# Patient Record
Sex: Male | Born: 1980 | Race: White | Marital: Married | Smoking: Never smoker
Health system: Southern US, Community
[De-identification: ages and names within clinical notes are randomized; demographics above are authoritative.]

## PROBLEM LIST (undated history)

## (undated) DIAGNOSIS — I10 Essential (primary) hypertension: Secondary | ICD-10-CM

## (undated) DIAGNOSIS — F1020 Alcohol dependence, uncomplicated: Secondary | ICD-10-CM

## (undated) DIAGNOSIS — F419 Anxiety disorder, unspecified: Secondary | ICD-10-CM

---

## 2010-09-10 ENCOUNTER — Other Ambulatory Visit: Payer: Self-pay | Admitting: Family Medicine

## 2010-09-10 ENCOUNTER — Ambulatory Visit
Admission: RE | Admit: 2010-09-10 | Discharge: 2010-09-10 | Disposition: A | Discharge status: Home / Self Care | Payer: BC Managed Care – PPO | Source: Ambulatory Visit | Attending: Family Medicine | Admitting: Family Medicine

## 2010-09-10 DIAGNOSIS — M545 Low back pain: Secondary | ICD-10-CM

## 2014-05-23 ENCOUNTER — Other Ambulatory Visit: Payer: Self-pay | Admitting: *Deleted

## 2014-05-23 ENCOUNTER — Ambulatory Visit
Admission: RE | Admit: 2014-05-23 | Discharge: 2014-05-23 | Disposition: A | Discharge status: Home / Self Care | Payer: PRIVATE HEALTH INSURANCE | Source: Ambulatory Visit | Attending: *Deleted | Admitting: *Deleted

## 2014-05-23 DIAGNOSIS — M79671 Pain in right foot: Secondary | ICD-10-CM

## 2015-08-10 IMAGING — CR DG FOOT COMPLETE 3+V*R*
3 series · 3 of 3 positions shown · non-contrast
Comparison: None.

CLINICAL DATA: Foot pain at the base of the fifth metatarsal for 1
week. No injury.

EXAM:
RIGHT FOOT COMPLETE - 3+ VIEW

[view not recorded (1 of 3)]
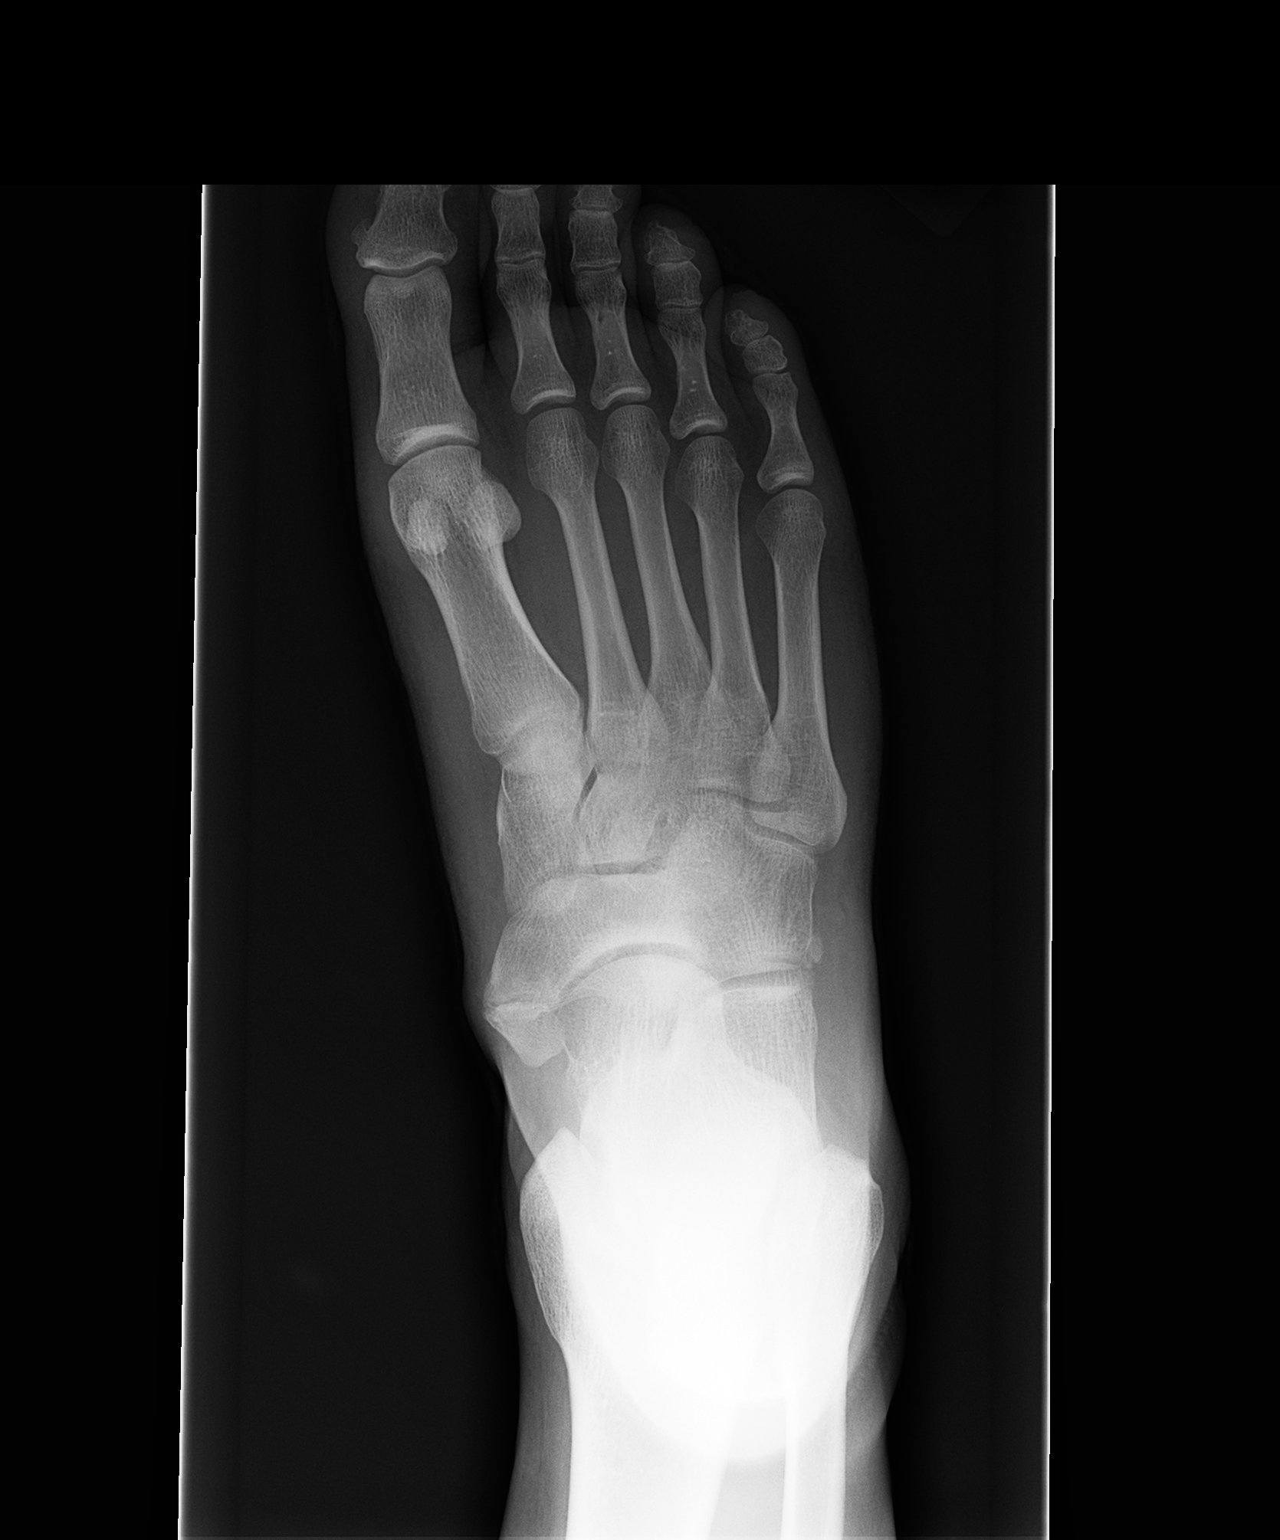

[view not recorded (2 of 3)]
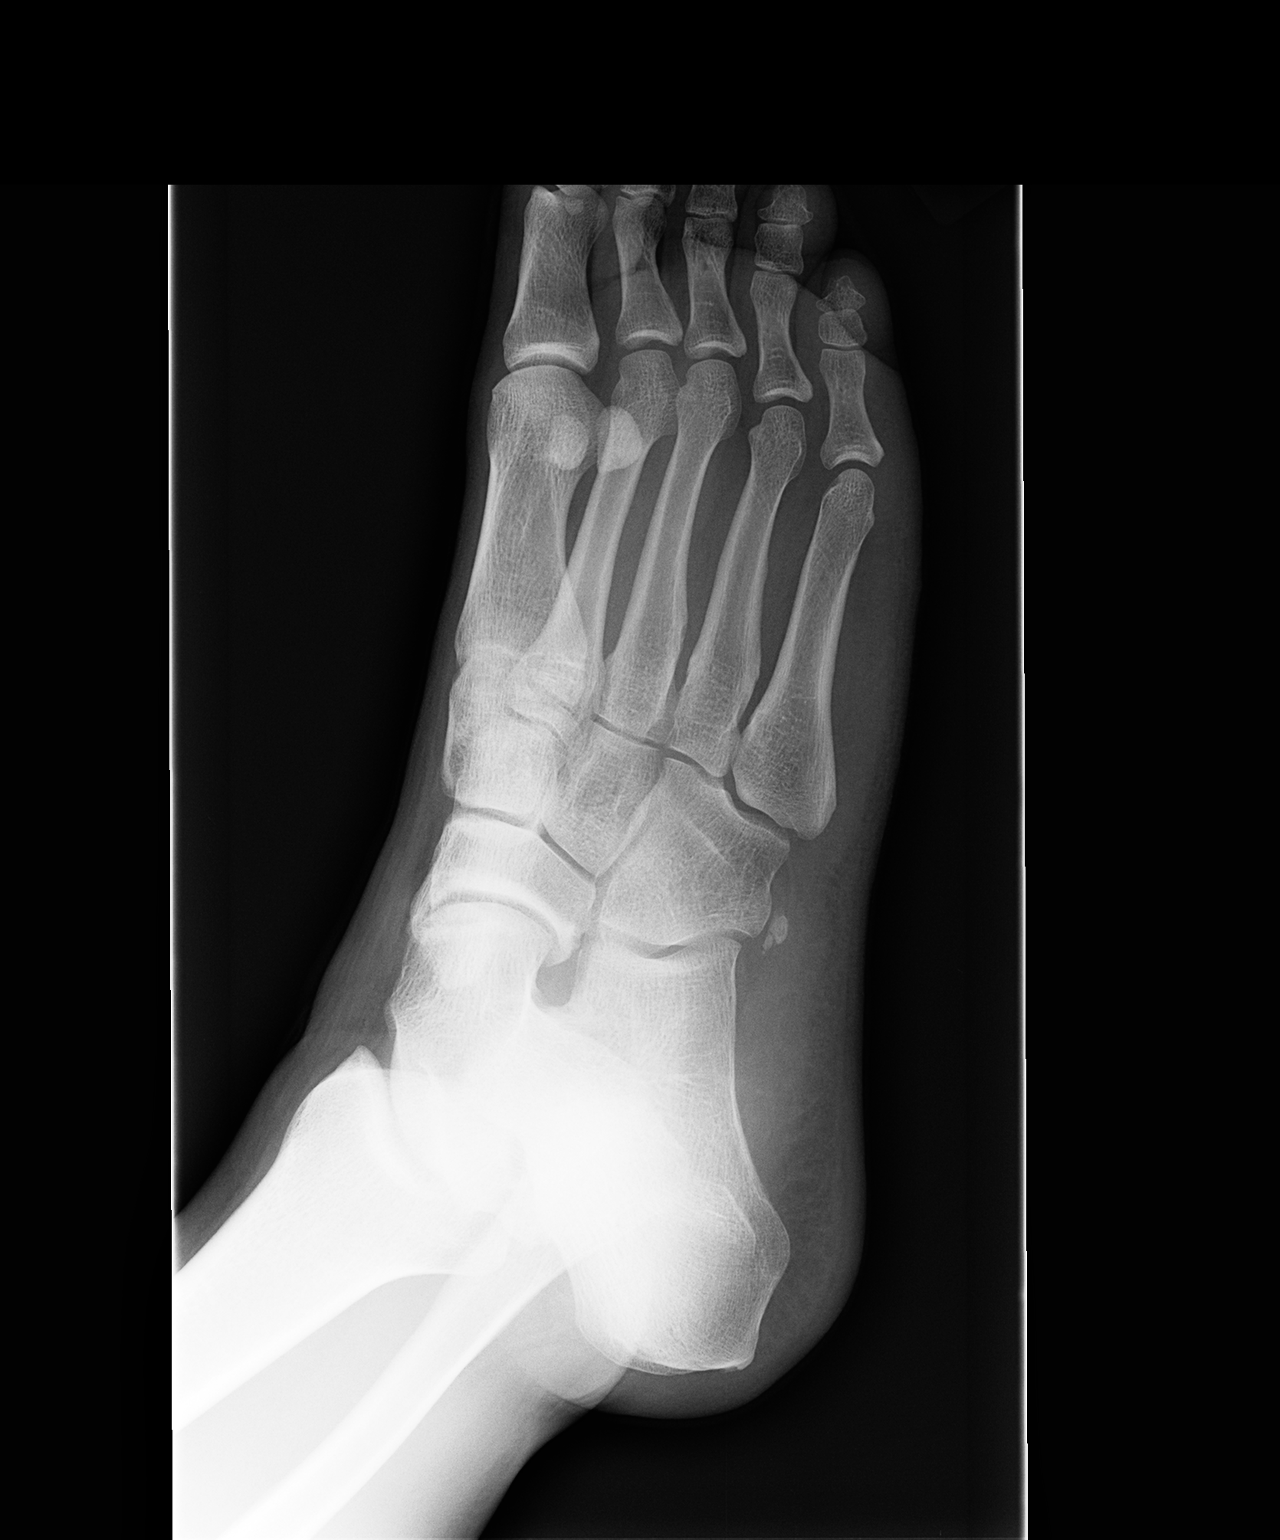

[view not recorded (3 of 3)]
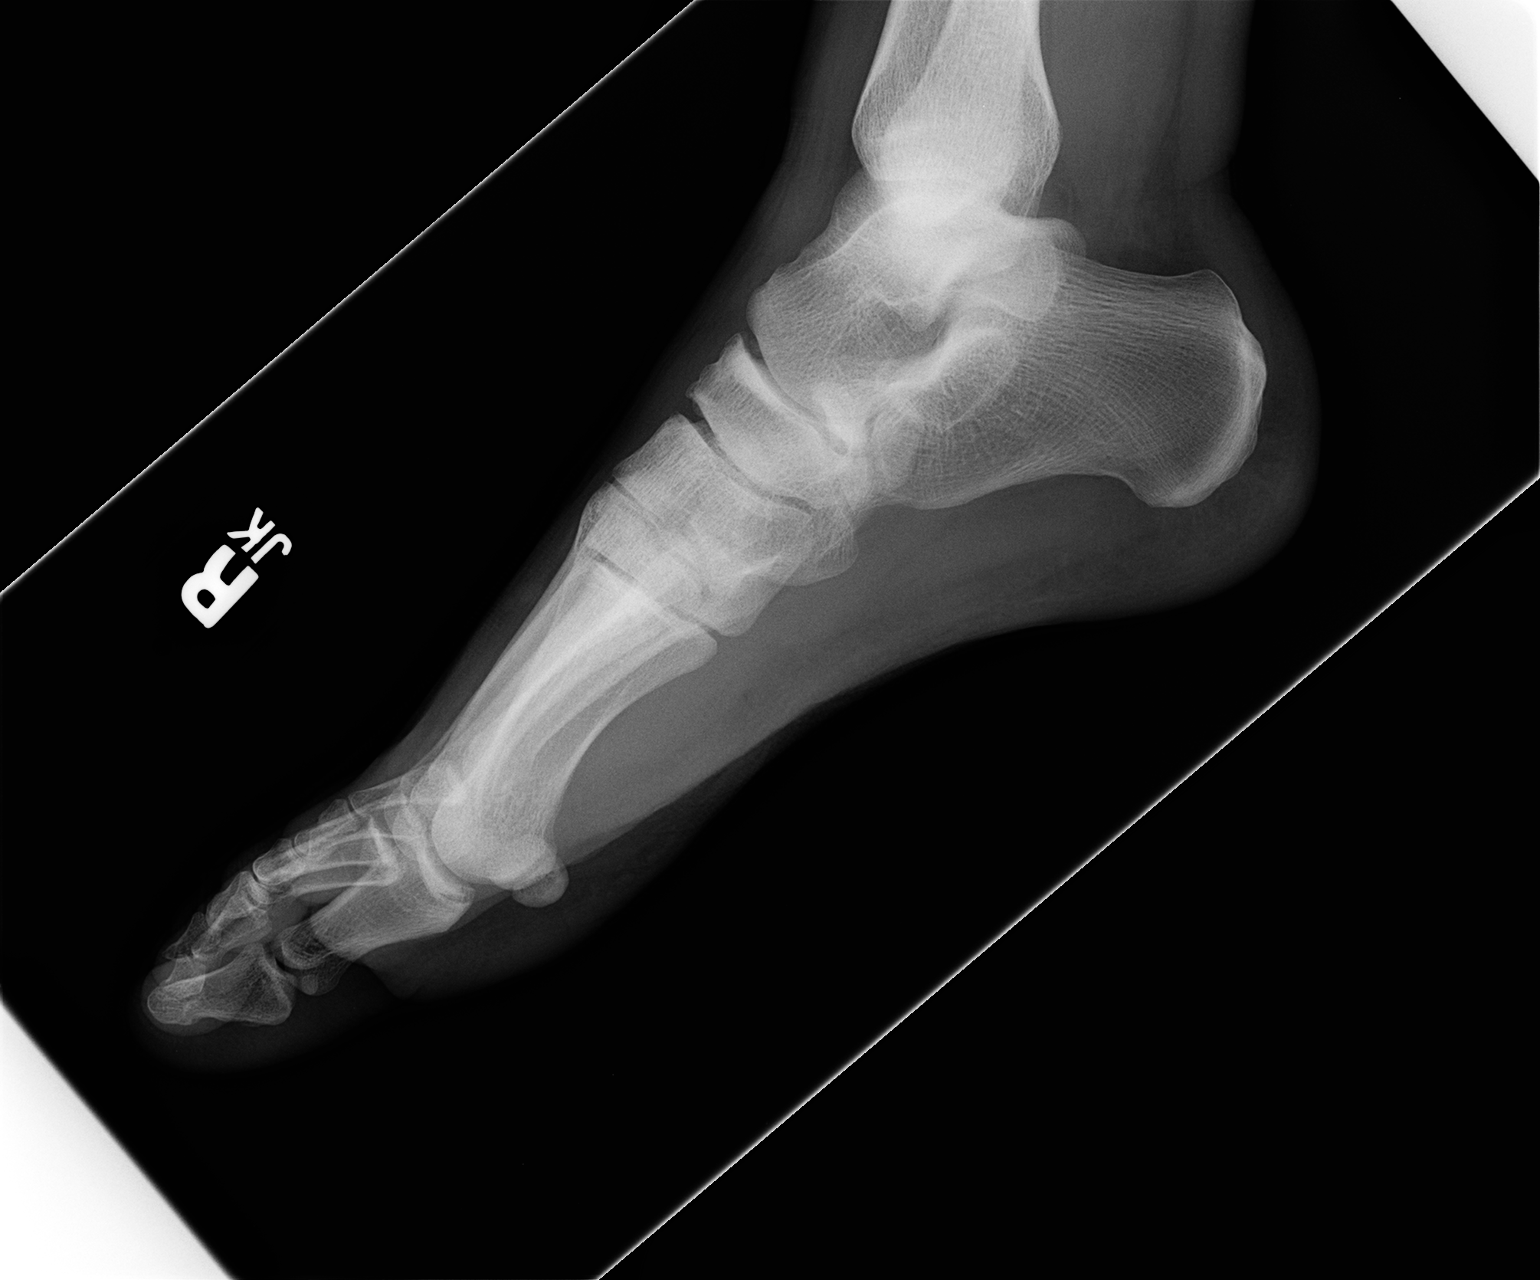

[3 of 3 positions shown; findings below may reference images not displayed]

FINDINGS: Accessory ossicle adjacent the cuboid laterally. No acute fracture
or dislocation.
IMPRESSION: No acute osseous abnormality.

## 2016-08-12 DIAGNOSIS — F329 Major depressive disorder, single episode, unspecified: Secondary | ICD-10-CM | POA: Insufficient documentation

## 2016-08-12 DIAGNOSIS — F32A Depression, unspecified: Secondary | ICD-10-CM | POA: Insufficient documentation

## 2016-08-12 DIAGNOSIS — H6692 Otitis media, unspecified, left ear: Secondary | ICD-10-CM | POA: Diagnosis not present

## 2016-08-12 DIAGNOSIS — R030 Elevated blood-pressure reading, without diagnosis of hypertension: Secondary | ICD-10-CM | POA: Diagnosis not present

## 2016-08-12 DIAGNOSIS — J019 Acute sinusitis, unspecified: Secondary | ICD-10-CM | POA: Diagnosis not present

## 2016-08-26 DIAGNOSIS — I10 Essential (primary) hypertension: Secondary | ICD-10-CM | POA: Diagnosis not present

## 2016-09-25 DIAGNOSIS — I10 Essential (primary) hypertension: Secondary | ICD-10-CM | POA: Diagnosis not present

## 2017-01-30 DIAGNOSIS — L905 Scar conditions and fibrosis of skin: Secondary | ICD-10-CM | POA: Diagnosis not present

## 2017-01-30 DIAGNOSIS — D2230 Melanocytic nevi of unspecified part of face: Secondary | ICD-10-CM | POA: Diagnosis not present

## 2017-01-30 DIAGNOSIS — D224 Melanocytic nevi of scalp and neck: Secondary | ICD-10-CM | POA: Diagnosis not present

## 2017-01-30 DIAGNOSIS — D2271 Melanocytic nevi of right lower limb, including hip: Secondary | ICD-10-CM | POA: Diagnosis not present

## 2017-01-30 DIAGNOSIS — D485 Neoplasm of uncertain behavior of skin: Secondary | ICD-10-CM | POA: Diagnosis not present

## 2017-01-30 DIAGNOSIS — Z86018 Personal history of other benign neoplasm: Secondary | ICD-10-CM | POA: Diagnosis not present

## 2017-01-30 DIAGNOSIS — D225 Melanocytic nevi of trunk: Secondary | ICD-10-CM | POA: Diagnosis not present

## 2017-02-28 DIAGNOSIS — D485 Neoplasm of uncertain behavior of skin: Secondary | ICD-10-CM | POA: Diagnosis not present

## 2017-02-28 DIAGNOSIS — L988 Other specified disorders of the skin and subcutaneous tissue: Secondary | ICD-10-CM | POA: Diagnosis not present

## 2017-03-27 DIAGNOSIS — Z23 Encounter for immunization: Secondary | ICD-10-CM | POA: Diagnosis not present

## 2017-03-27 DIAGNOSIS — R030 Elevated blood-pressure reading, without diagnosis of hypertension: Secondary | ICD-10-CM | POA: Diagnosis not present

## 2017-04-03 DIAGNOSIS — L723 Sebaceous cyst: Secondary | ICD-10-CM | POA: Diagnosis not present

## 2017-04-03 DIAGNOSIS — L989 Disorder of the skin and subcutaneous tissue, unspecified: Secondary | ICD-10-CM | POA: Diagnosis not present

## 2017-04-03 DIAGNOSIS — L7211 Pilar cyst: Secondary | ICD-10-CM | POA: Diagnosis not present

## 2017-05-09 DIAGNOSIS — J019 Acute sinusitis, unspecified: Secondary | ICD-10-CM | POA: Diagnosis not present

## 2017-06-30 DIAGNOSIS — Z0000 Encounter for general adult medical examination without abnormal findings: Secondary | ICD-10-CM | POA: Diagnosis not present

## 2017-07-31 DIAGNOSIS — J209 Acute bronchitis, unspecified: Secondary | ICD-10-CM | POA: Diagnosis not present

## 2017-08-25 DIAGNOSIS — R0981 Nasal congestion: Secondary | ICD-10-CM | POA: Diagnosis not present

## 2017-08-25 DIAGNOSIS — R0982 Postnasal drip: Secondary | ICD-10-CM | POA: Diagnosis not present

## 2017-11-30 ENCOUNTER — Emergency Department (HOSPITAL_COMMUNITY)
Admission: EM | Admit: 2017-11-30 | Discharge: 2017-11-30 | Disposition: A | Discharge status: Home / Self Care | Payer: 59 | Attending: Emergency Medicine | Admitting: Emergency Medicine

## 2017-11-30 ENCOUNTER — Encounter (HOSPITAL_COMMUNITY): Payer: Self-pay | Admitting: Emergency Medicine

## 2017-11-30 ENCOUNTER — Emergency Department (HOSPITAL_COMMUNITY): Payer: 59

## 2017-11-30 DIAGNOSIS — F10930 Alcohol use, unspecified with withdrawal, uncomplicated: Secondary | ICD-10-CM

## 2017-11-30 DIAGNOSIS — J811 Chronic pulmonary edema: Secondary | ICD-10-CM | POA: Diagnosis not present

## 2017-11-30 DIAGNOSIS — I10 Essential (primary) hypertension: Secondary | ICD-10-CM | POA: Insufficient documentation

## 2017-11-30 DIAGNOSIS — R000 Tachycardia, unspecified: Secondary | ICD-10-CM | POA: Diagnosis not present

## 2017-11-30 DIAGNOSIS — R918 Other nonspecific abnormal finding of lung field: Secondary | ICD-10-CM | POA: Diagnosis not present

## 2017-11-30 DIAGNOSIS — R9389 Abnormal findings on diagnostic imaging of other specified body structures: Secondary | ICD-10-CM

## 2017-11-30 DIAGNOSIS — F10230 Alcohol dependence with withdrawal, uncomplicated: Secondary | ICD-10-CM | POA: Diagnosis present

## 2017-11-30 DIAGNOSIS — Z79899 Other long term (current) drug therapy: Secondary | ICD-10-CM | POA: Diagnosis not present

## 2017-11-30 HISTORY — DX: Alcohol dependence, uncomplicated: F10.20

## 2017-11-30 HISTORY — DX: Anxiety disorder, unspecified: F41.9

## 2017-11-30 HISTORY — DX: Essential (primary) hypertension: I10

## 2017-11-30 LAB — COMPREHENSIVE METABOLIC PANEL
ALBUMIN: 4.5 g/dL (ref 3.5–5.0)
ALT: 89 U/L — AB (ref 17–63)
ANION GAP: 14 (ref 5–15)
AST: 54 U/L — ABNORMAL HIGH (ref 15–41)
Alkaline Phosphatase: 47 U/L (ref 38–126)
BILIRUBIN TOTAL: 1.3 mg/dL — AB (ref 0.3–1.2)
BUN: 14 mg/dL (ref 6–20)
CO2: 23 mmol/L (ref 22–32)
Calcium: 8.8 mg/dL — ABNORMAL LOW (ref 8.9–10.3)
Chloride: 97 mmol/L — ABNORMAL LOW (ref 101–111)
Creatinine, Ser: 0.9 mg/dL (ref 0.61–1.24)
GFR calc Af Amer: >60 mL/min (ref >60)
GFR calc non Af Amer: >60 mL/min (ref >60)
GLUCOSE: 108 mg/dL — AB (ref 65–99)
POTASSIUM: 3.8 mmol/L (ref 3.5–5.1)
SODIUM: 134 mmol/L — AB (ref 135–145)
TOTAL PROTEIN: 7.4 g/dL (ref 6.5–8.1)

## 2017-11-30 LAB — CBC
HEMATOCRIT: 49.9 % (ref 39.0–52.0)
HEMOGLOBIN: 17.9 g/dL — AB (ref 13.0–17.0)
MCH: 32.2 pg (ref 26.0–34.0)
MCHC: 35.9 g/dL (ref 30.0–36.0)
MCV: 89.7 fL (ref 78.0–100.0)
Platelets: 334 10*3/uL (ref 150–400)
RBC: 5.56 MIL/uL (ref 4.22–5.81)
RDW: 11.8 % (ref 11.5–15.5)
WBC: 6.2 10*3/uL (ref 4.0–10.5)

## 2017-11-30 LAB — RAPID URINE DRUG SCREEN, HOSP PERFORMED
AMPHETAMINES: NOT DETECTED
Benzodiazepines: NOT DETECTED
Cocaine: NOT DETECTED
OPIATES: NOT DETECTED
TETRAHYDROCANNABINOL: NOT DETECTED

## 2017-11-30 LAB — ETHANOL: Alcohol, Ethyl (B): 295 mg/dL — ABNORMAL HIGH (ref <10)

## 2017-11-30 MED ORDER — CHLORDIAZEPOXIDE HCL 25 MG PO CAPS: ORAL_CAPSULE | ORAL | 0 refills | Status: DC

## 2017-11-30 MED ORDER — ONDANSETRON HCL 4 MG/2ML IJ SOLN
4.0000 mg | Freq: Once | INTRAMUSCULAR | Status: AC
  Administered 2017-11-30: 4 mg via INTRAVENOUS
  Filled 2017-11-30: qty 2

## 2017-11-30 MED ORDER — SODIUM CHLORIDE 0.9 % IV BOLUS
1000.0000 mL | Freq: Once | INTRAVENOUS | Status: AC
  Administered 2017-11-30: 1000 mL via INTRAVENOUS

## 2017-11-30 MED ORDER — LORAZEPAM 1 MG PO TABS: 0.0000 mg | ORAL_TABLET | Freq: Four times a day (QID) | ORAL | Status: DC

## 2017-11-30 MED ORDER — VITAMIN B-1 100 MG PO TABS: 100.0000 mg | ORAL_TABLET | Freq: Every day | ORAL | Status: DC

## 2017-11-30 MED ORDER — LORAZEPAM 1 MG PO TABS: 0.0000 mg | ORAL_TABLET | Freq: Two times a day (BID) | ORAL | Status: DC

## 2017-11-30 MED ORDER — ONDANSETRON 4 MG PO TBDP: 4.0000 mg | ORAL_TABLET | Freq: Three times a day (TID) | ORAL | 0 refills | Status: AC | PRN

## 2017-11-30 MED ORDER — THIAMINE HCL 100 MG/ML IJ SOLN
100.0000 mg | Freq: Every day | INTRAMUSCULAR | Status: DC
  Administered 2017-11-30: 100 mg via INTRAVENOUS
  Filled 2017-11-30: qty 2

## 2017-11-30 MED ORDER — LORAZEPAM 2 MG/ML IJ SOLN
0.0000 mg | Freq: Four times a day (QID) | INTRAMUSCULAR | Status: DC
  Administered 2017-11-30: 1 mg via INTRAVENOUS
  Filled 2017-11-30: qty 1

## 2017-11-30 MED ORDER — LORAZEPAM 2 MG/ML IJ SOLN
0.5000 mg | Freq: Once | INTRAMUSCULAR | Status: AC
  Administered 2017-11-30: 0.5 mg via INTRAVENOUS
  Filled 2017-11-30: qty 1

## 2017-11-30 MED ORDER — LORAZEPAM 2 MG/ML IJ SOLN: 1.0000 mg | Freq: Once | INTRAMUSCULAR | Status: DC

## 2017-11-30 MED ORDER — LORAZEPAM 2 MG/ML IJ SOLN: 0.0000 mg | Freq: Two times a day (BID) | INTRAMUSCULAR | Status: DC

## 2017-11-30 NOTE — ED Notes (Signed)
pt ambulated independently maintaining an O2 sat of 98%.  HR increased from 108 to 125 while ambulating.  Pt did not appear to be in distress.  HR returned to 110 post-ambulation

## 2017-11-30 NOTE — ED Triage Notes (Signed)
Patient to ED with wife for alcohol withdrawal. She reports patient is a recovering alcoholic but has had relapsed multiple times this past month. Patient had panic attack Wednesday and has been self medicating with several alcohol-sized bottles daily until yesterday morning. Patient reports some intermittent confusion (A&O x 4 at this time), loss of appetite, N/V. He also states he hasn't taken his normal anxiety medication in days. Patient calm and cooperative at this time.

## 2017-11-30 NOTE — Discharge Instructions (Signed)
Take Librium taper as prescribed.  You may also take Zofran up to 3 times daily as needed for nausea and vomiting.  Wait around 20 to 30 minutes before having anything to eat or drink to give this medication time to work.  Do not drink any alcohol while taking Librium.  Follow-up with your primary care physician for reevaluation of your symptoms and possible repeat chest x-ray to ensure the fluid in your lungs has improved or resolved.  I have attached resources for follow-up with a counselor on an outpatient basis.  Return to the emergency department immediately for any concerning signs or symptoms develop such as fevers, seizures, loss of consciousness, or persistent vomiting.

## 2017-11-30 NOTE — ED Provider Notes (Signed)
[facility] Provider Note   CSN: [id number] Arrival date & time: 11/30/17  1439     History   Chief Complaint Chief Complaint  Patient presents with  . Alcohol Problem    HPI Raymond Miller is a 37 y.o. male with history of alcoholism, anxiety, and hypertension presents today accompanied by his wife for evaluation of alcohol withdrawal.  Per the patient's wife, he has been in recovery for some time but has relapsed multiple times in the past month.  This is typically subsequent to a panic attack.  Patient states that he will "self medicate "with alcohol after having panic attacks which he has dealt with since he was in his 60s.  Wife states that he usually will medicate with a few beers but while she was out of town on Wednesday 4 days ago he began drinking airplane bottles of liquor.  He is unsure how many bottles of liquor he has consumed in the past 4 days.  Wife states that his last drink was yesterday morning.  He does take Wellbutrin and Lexapro for his anxiety but has not been compliant with this medication for some time.  His PCP manages his anxiety but he does not see a psychiatrist or counselor.  He denies suicidal ideation or homicidal ideation, denies auditory or visual hallucinations.  He does use chewing tobacco but denies cigarette smoking.  He denies recreational drug use.  Patient's wife states for the past couple of days he has been fatigued/lethargic, confused.  The patient states that he feels very anxious,nauseated, and states "I feel warm all over ".  Has not been able to keep any food down for the past 2 to 3 days.  He notes at least 2 episodes of nonbloody nonbilious emesis today.  Has not tried anything for his symptoms.  He denies fevers, chills, chest pain, shortness of breath, abdominal pain, diarrhea, constipation.  Has not had seizures with alcohol withdrawals in the past.  Has not required hospitalization for alcohol withdrawal in  the past.  The history is provided by the patient.    Past Medical History:  Diagnosis Date  . Alcoholism ([place])   . Anxiety   . Hypertension     There are no active problems to display for this patient.   History reviewed. No pertinent surgical history.      Home Medications    Prior to Admission medications   Medication Sig Start Date End Date Taking? Authorizing Provider  buPROPion (WELLBUTRIN SR) 150 MG 12 hr tablet Take 150 mg by mouth 2 (two) times daily. 10/16/17  Yes [provider]  escitalopram (LEXAPRO) 5 MG tablet Take 5 mg by mouth 2 (two) times daily. 11/09/17  Yes [provider]  LORazepam (ATIVAN) 0.5 MG tablet Take 0.5 mg by mouth every 8 (eight) hours as needed for anxiety. 10/22/17  Yes [provider]  chlordiazePOXIDE (LIBRIUM) 25 MG capsule 50mg  PO TID x 1D, then 25-50mg  PO BID X 1D, then 25-50mg  PO QD X 1D 11/30/17   Daniel Johndrow A, PA-C  ondansetron (ZOFRAN ODT) 4 MG disintegrating tablet Take 1 tablet (4 mg total) by mouth every 8 (eight) hours as needed for up to 4 days for nausea or vomiting. 11/30/17 12/04/17  Renita Papa, PA-C    Family History No family history on file.  Social History Social History   Tobacco Use  . Smoking status: Never Smoker  . Smokeless tobacco: Never Used  Substance Use  Topics  . Alcohol use: Yes    Comment: several relapses this past month (6/19)  . Drug use: Not Currently     Allergies   Penicillins   Review of Systems Review of Systems  Constitutional: Positive for fatigue. Negative for chills and fever.  Respiratory: Negative for shortness of breath.   Cardiovascular: Negative for chest pain.  Gastrointestinal: Positive for nausea and vomiting. Negative for abdominal pain, constipation and diarrhea.  Musculoskeletal: Positive for myalgias.  Neurological: Negative for syncope, weakness, numbness and headaches.  Psychiatric/Behavioral: Positive for confusion.  All other systems  reviewed and are negative.    Physical Exam Updated Vital Signs BP (!) 148/96   Pulse (!) 104   Temp 98.8 F (37.1 C) (Oral)   Resp 19   Ht 5\' 6"  (1.676 m)   Wt 83.9 kg (185 lb)   SpO2 90%   BMI 29.86 kg/m   Physical Exam  Constitutional: He is oriented to person, place, and time. He appears well-developed and well-nourished. No distress.  HENT:  Head: Normocephalic and atraumatic.  Eyes: Pupils are equal, round, and reactive to light. Conjunctivae and EOM are normal. Right eye exhibits no discharge. Left eye exhibits no discharge.  Neck: Normal range of motion. Neck supple. No JVD present. No tracheal deviation present.  Cardiovascular: Regular rhythm.  Tachycardic to 105 bpm, 2+ radial and DP/PT pulses, Homans sign absent bilaterally  Pulmonary/Chest: Effort normal and breath sounds normal. No respiratory distress. He exhibits no tenderness.  Abdominal: Soft. Bowel sounds are normal. He exhibits no distension. There is no tenderness.  Musculoskeletal: Normal range of motion. He exhibits no edema or tenderness.  Neurological: He is alert and oriented to person, place, and time. No cranial nerve deficit or sensory deficit. He exhibits normal muscle tone.  Fluent speech with no evidence of dysarthria or aphasia, no facial droop.  Sensation intact to soft extremities.  EOMs cranial nerves appear grossly intact.  Answers questions appropriately.  No tremulousness of the extremities  Skin: Skin is warm and dry. No erythema.  Psychiatric: His speech is normal. His mood appears anxious. He expresses no homicidal and no suicidal ideation. He expresses no suicidal plans and no homicidal plans.  Nursing note and vitals reviewed.    ED Treatments / Results  Labs (all labs ordered are listed, but only abnormal results are displayed) Labs Reviewed  COMPREHENSIVE METABOLIC PANEL - Abnormal; Notable for the following components:      Result Value   Sodium 134 (*)    Chloride 97 (*)     Glucose, Bld 108 (*)    Calcium 8.8 (*)    AST 54 (*)    ALT 89 (*)    Total Bilirubin 1.3 (*)    All other components within normal limits  ETHANOL - Abnormal; Notable for the following components:   Alcohol, Ethyl (B) 295 (*)    All other components within normal limits  CBC - Abnormal; Notable for the following components:   Hemoglobin 17.9 (*)    All other components within normal limits  RAPID URINE DRUG SCREEN, HOSP PERFORMED - Abnormal; Notable for the following components:   Barbiturates   (*)    Value: Result not available. Reagent lot number recalled by manufacturer.   All other components within normal limits    EKG EKG Interpretation  Date/Time:  Sunday November 30 2017 18:48:27 EDT Ventricular Rate:  122 PR Interval:    QRS Duration: 88 QT Interval:  310 QTC Calculation:  442 R Axis:   101 Text Interpretation:  Sinus tachycardia Probable left atrial enlargement Right axis deviation Baseline wander in lead(s) I III aVL V6 Confirmed by Quintella Reichert [phone]) on 11/30/2017 8:26:43 PM Also confirmed by Quintella Reichert [phone]), editor Lynder Parents [phone])  on 12/01/2017 10:54:05 AM   Radiology Dg Chest 2 View  Result Date: 11/30/2017 CLINICAL DATA:  Alcohol withdrawal. EXAM: CHEST - 2 VIEW COMPARISON:  None. FINDINGS: The heart size and mediastinal contours are within normal limits. Mild diffuse interstitial edema. No alveolar consolidation, effusion or pneumothorax. The visualized skeletal structures are unremarkable. IMPRESSION: Mild diffuse interstitial edema. No effusion or pneumothorax. No pulmonary consolidation. Electronically Signed   By: Ashley Royalty M.D.   On: 11/30/2017 20:17    Procedures Procedures (including critical care time)  Medications Ordered in ED Medications  ondansetron (ZOFRAN) injection 4 mg (4 mg Intravenous Given 11/30/17 1615)  sodium chloride 0.9 % bolus 1,000 mL (0 mLs Intravenous Stopped 11/30/17 1712)  LORazepam (ATIVAN) injection 0.5 mg  (0.5 mg Intravenous Given 11/30/17 1617)  ondansetron (ZOFRAN) injection 4 mg (4 mg Intravenous Given 11/30/17 2010)     Initial Impression / Assessment and Plan / ED Course  I have reviewed the triage vital signs and the nursing notes.  Pertinent labs & imaging results that were available during my care of the patient were reviewed by me and considered in my medical decision making (see chart for details).  Patient presents for evaluation of alcohol withdrawal.  States his last drink was yesterday morning but ethanol level today is 295.  He is afebrile, intermittently tachycardic while in the ED appears somewhat anxious but in no apparent distress.  No focal neurologic deficits on examination and he is alert and oriented.  No tremulousness or seizures.  He has not been hospitalized in the past for withdrawals.  Initial CIWA score is 6 with improvement after Ativan.  Chest x-ray shows mild diffuse interstitial edema however when the patient was ambulated he maintain his O2 saturations.  He does not appear to be volume overloaded and no evidence of fulminant CHF.  Doubt PE, ACS or MI, or other acute intrathoracic abnormality in the absence of chest pain or shortness of breath.  Remainder of lab work reviewed by me shows no anemia, no significant electrolyte abnormalities.  On reevaluation the patient is resting comfortably no apparent distress.  He states he is feeling much better.  Serial abdominal examinations remain benign and I doubt acute intra-abdominal pathology.  Suspect his nausea and vomiting secondary to his withdrawals.  He was able to tolerate p.o. food and fluids in the ED after Zofran.  Will discharge with Librium taper and Zofran as needed for nausea and vomiting.  He request outpatient resources for counseling and substance abuse services which I provided to him.  Discussed strict ED return precautions.  Patient and patient's wife verbalized understanding of and agreement with plan and  patient stable for discharge home at this time.  Patient was seen and evaluated by Dr. Ralene Bathe who agrees with assessment and plan at this time.  Final Clinical Impressions(s) / ED Diagnoses   Final diagnoses:  Alcohol withdrawal syndrome without complication (HCC)  Abnormal chest x-ray    ED Discharge Orders        Ordered    chlordiazePOXIDE (LIBRIUM) 25 MG capsule     11/30/17 2150    ondansetron (ZOFRAN ODT) 4 MG disintegrating tablet  Every 8 hours PRN     11/30/17 2150  Renita Papa, PA-C 12/01/17 1512    Quintella Reichert, MD 12/09/17 [phone]

## 2017-11-30 NOTE — ED Notes (Signed)
Patient given ginger ale. 

## 2017-12-03 DIAGNOSIS — J811 Chronic pulmonary edema: Secondary | ICD-10-CM | POA: Diagnosis not present

## 2017-12-03 DIAGNOSIS — R112 Nausea with vomiting, unspecified: Secondary | ICD-10-CM | POA: Diagnosis not present

## 2017-12-05 ENCOUNTER — Ambulatory Visit
Admission: RE | Admit: 2017-12-05 | Discharge: 2017-12-05 | Disposition: A | Discharge status: Home / Self Care | Payer: 59 | Source: Ambulatory Visit | Attending: Family Medicine | Admitting: Family Medicine

## 2017-12-05 ENCOUNTER — Other Ambulatory Visit: Payer: Self-pay | Admitting: Family Medicine

## 2017-12-05 DIAGNOSIS — J810 Acute pulmonary edema: Secondary | ICD-10-CM

## 2017-12-05 DIAGNOSIS — J811 Chronic pulmonary edema: Secondary | ICD-10-CM | POA: Diagnosis not present

## 2017-12-29 DIAGNOSIS — I10 Essential (primary) hypertension: Secondary | ICD-10-CM | POA: Diagnosis not present

## 2018-02-14 DIAGNOSIS — J9801 Acute bronchospasm: Secondary | ICD-10-CM | POA: Diagnosis not present

## 2018-02-14 DIAGNOSIS — J019 Acute sinusitis, unspecified: Secondary | ICD-10-CM | POA: Diagnosis not present

## 2018-03-25 DIAGNOSIS — Z23 Encounter for immunization: Secondary | ICD-10-CM | POA: Diagnosis not present

## 2018-07-01 DIAGNOSIS — Z0000 Encounter for general adult medical examination without abnormal findings: Secondary | ICD-10-CM | POA: Diagnosis not present

## 2018-07-01 DIAGNOSIS — E785 Hyperlipidemia, unspecified: Secondary | ICD-10-CM | POA: Diagnosis not present

## 2018-07-01 DIAGNOSIS — I10 Essential (primary) hypertension: Secondary | ICD-10-CM | POA: Diagnosis not present

## 2018-08-06 DIAGNOSIS — M545 Low back pain: Secondary | ICD-10-CM | POA: Diagnosis not present

## 2018-09-18 DIAGNOSIS — M9905 Segmental and somatic dysfunction of pelvic region: Secondary | ICD-10-CM | POA: Diagnosis not present

## 2018-09-18 DIAGNOSIS — M9902 Segmental and somatic dysfunction of thoracic region: Secondary | ICD-10-CM | POA: Diagnosis not present

## 2018-09-18 DIAGNOSIS — M9903 Segmental and somatic dysfunction of lumbar region: Secondary | ICD-10-CM | POA: Diagnosis not present

## 2018-11-26 ENCOUNTER — Ambulatory Visit: Payer: Self-pay

## 2018-11-26 ENCOUNTER — Other Ambulatory Visit: Payer: Self-pay

## 2018-11-26 ENCOUNTER — Ambulatory Visit (INDEPENDENT_AMBULATORY_CARE_PROVIDER_SITE_OTHER): Payer: 59 | Admitting: Family Medicine

## 2018-11-26 ENCOUNTER — Encounter: Payer: Self-pay | Admitting: Family Medicine

## 2018-11-26 DIAGNOSIS — F419 Anxiety disorder, unspecified: Secondary | ICD-10-CM | POA: Insufficient documentation

## 2018-11-26 DIAGNOSIS — M545 Low back pain, unspecified: Secondary | ICD-10-CM

## 2018-11-26 DIAGNOSIS — G8929 Other chronic pain: Secondary | ICD-10-CM | POA: Diagnosis not present

## 2018-11-26 DIAGNOSIS — M25552 Pain in left hip: Secondary | ICD-10-CM | POA: Diagnosis not present

## 2018-11-26 MED ORDER — TIZANIDINE HCL 2 MG PO TABS: 2.0000 mg | ORAL_TABLET | Freq: Four times a day (QID) | ORAL | 1 refills | Status: AC | PRN

## 2018-11-26 MED ORDER — NABUMETONE 750 MG PO TABS: 750.0000 mg | ORAL_TABLET | Freq: Two times a day (BID) | ORAL | 6 refills | Status: AC | PRN

## 2018-11-26 NOTE — Progress Notes (Signed)
Office Visit Note   Patient: Raymond Miller           Date of Birth: January 27, 1981           MRN: [id number] Visit Date: 11/26/2018 Requested by: [name], [place],  [place] [address] PCP: [name], MD  Subjective: Chief Complaint  Patient presents with  . Left Hip - Pain    Pain started in lower back and around to the hip last November. Has had lower back pain since 2013. Occasionally pain radiates down the leg.    HPI: He is a 38 year old with low back and left hip pain.  Longstanding intermittent problems with his lower back since childhood.  Around 2013 he had a severe episode of pain during which time he was treated with chiropractic, muscle relaxants, and had some x-rays done which were unremarkable per his report.  The muscle relaxants seemed to help and he was tolerating his intermittent pain until this past November.  There was no injury to cause this.  Pain pattern is different than usual, with left lower back pain and deep pain in the anterior hip down toward the knee.  Position does not matter, but his pain gets much worse when he is not being physically active.  Surprisingly, physical activity does not seem to affect his pain and he actually feels pretty good sometimes while exercising or working in his yard.  He tried chiropractic again but it did not help.  Medications including ibuprofen gives him some relief.  He does have a family history of HLA-B27 positivity in a couple family members and his mother has been diagnosed with ankylosing spondylitis.               ROS: Has bowel or bladder dysfunction, fever or chills.  No other joints are bothering him.  All other systems were reviewed and are negative.  Objective: Vital Signs: There were no vitals taken for this visit.  Physical Exam:  General:  Alert and oriented, in no acute distress. Pulm:  Breathing unlabored. Psy:  Normal mood, congruent affect. Skin: No rash on his skin.  Low back: No scoliosis, no tenderness to palpation of the lumbar spinous processes or SI joints, no pain in the sciatic notch.  Bilateral tight hamstrings but negative straight leg raise, lower extremity strength and reflexes are normal.  No pain and good range of motion with bilateral hip internal and external rotation.  No pain with lumbar hyperextension maneuvers.  Imaging: X-rays lumbar spine:  Moderate L5-S1 DDD.  No other disc space abnormalities.  No SI sclerosis.  No scoliosis or congenital deformities.  X-rays left hip:  Normal joint space, no sign of stress fracture.    Assessment & Plan: 1.  Chronic low back pain now with left hip pain, etiology uncertain.  Suspect lumbar disc protrusion although pain pattern is not typical.  Family history of ankylosing spondylitis, but symptoms are not typical for that either. -MRI to further evaluate.  Suggested labs as well, but he is somewhat needle phobic and would prefer to wait on that for now. -Try some new medications as well.  Consider a trial of physical therapy depending on MRI results.     Procedures: No procedures performed  No notes on file     PMFS History: Patient Active Problem List   Diagnosis Date Noted  . Anxiety 11/26/2018  . Depressive disorder 08/12/2016   Past Medical History:  Diagnosis Date  .  Alcoholism ([place])   . Anxiety   . Hypertension     History reviewed. No pertinent family history.  History reviewed. No pertinent surgical history. Social History   Occupational History  . Not on file  Tobacco Use  . Smoking status: Never Smoker  . Smokeless tobacco: Never Used  Substance and Sexual Activity  . Alcohol use: Yes    Comment: several relapses this past month (6/19)  . Drug use: Not Currently  . Sexual activity: Not on file

## 2018-12-04 ENCOUNTER — Other Ambulatory Visit: Payer: Self-pay | Admitting: Family Medicine

## 2018-12-04 DIAGNOSIS — M545 Low back pain, unspecified: Secondary | ICD-10-CM

## 2018-12-04 DIAGNOSIS — G8929 Other chronic pain: Secondary | ICD-10-CM

## 2018-12-16 ENCOUNTER — Telehealth: Payer: Self-pay | Admitting: Family Medicine

## 2018-12-16 NOTE — Telephone Encounter (Signed)
The PT order has been refaxed over to [facility] (we have had some issues with the fax machine lately). I left this information on the patient's voice mail, along with their phone # so he could call and schedule his own appointment - [phone].

## 2018-12-16 NOTE — Telephone Encounter (Signed)
Patient called advised the (PT) has not contacted him for (PT) and want to know what to do next? The number to contact patient is [phone]

## 2018-12-22 ENCOUNTER — Other Ambulatory Visit: Payer: 59

## 2018-12-30 ENCOUNTER — Telehealth: Payer: Self-pay | Admitting: *Deleted

## 2018-12-30 NOTE — Telephone Encounter (Signed)
Pt called stating he is wanting to switch PT providers from [facility] to [facility] physical therapy, pt states his wife goes to [facility] close to hospital and would like to go there. Pt is going to call me back to give me the information so I can fax orders to them. Pt verbally agreed to send over referral to them

## 2019-02-22 IMAGING — DX DG CHEST 2V
2 series · 2 of 2 positions shown · non-contrast
Comparison: 11/30/2017

CLINICAL DATA: Follow-up pulmonary edema

EXAM:
CHEST - 2 VIEW

[dg chest 2 view (1 of 2)]
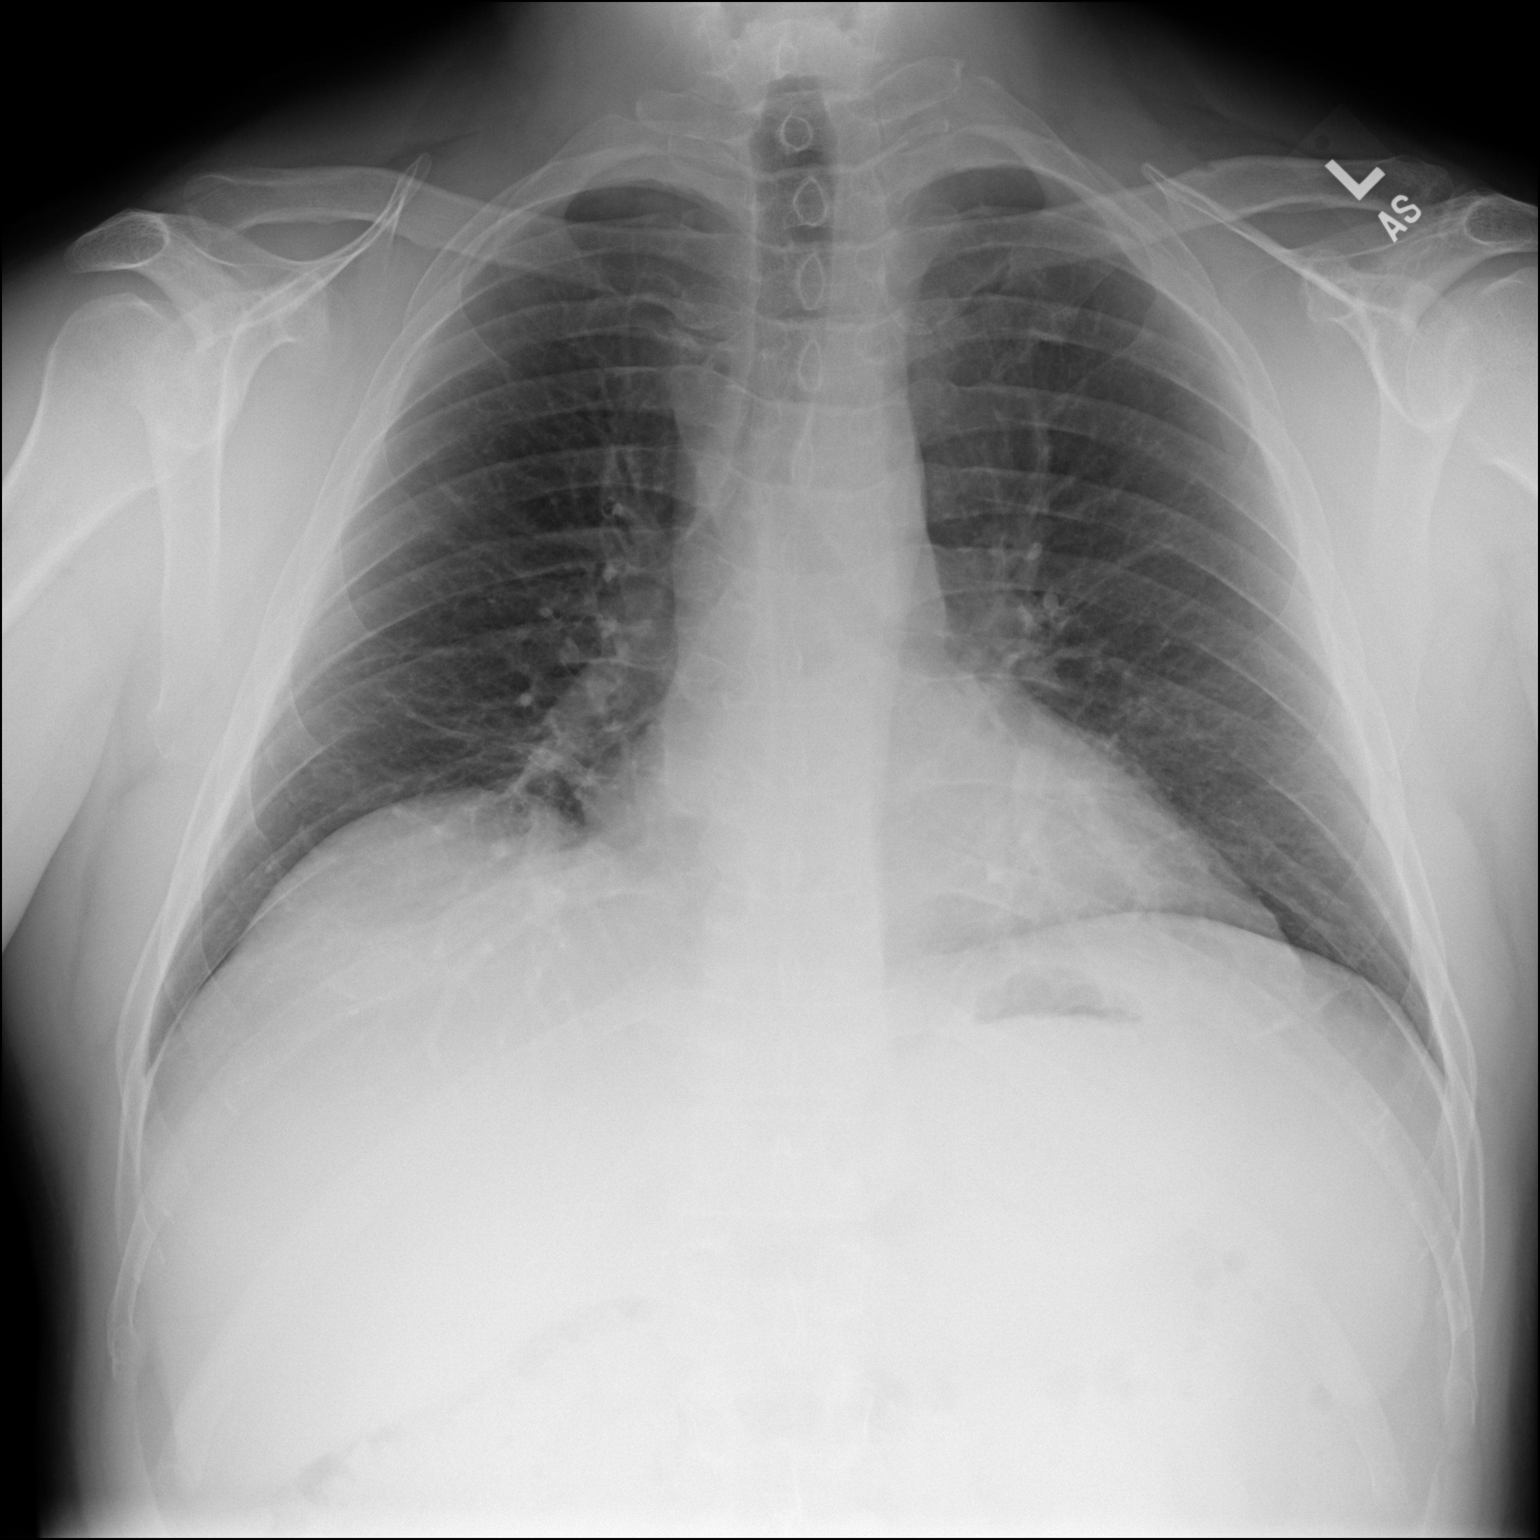

[dg chest 2 view (2 of 2)]
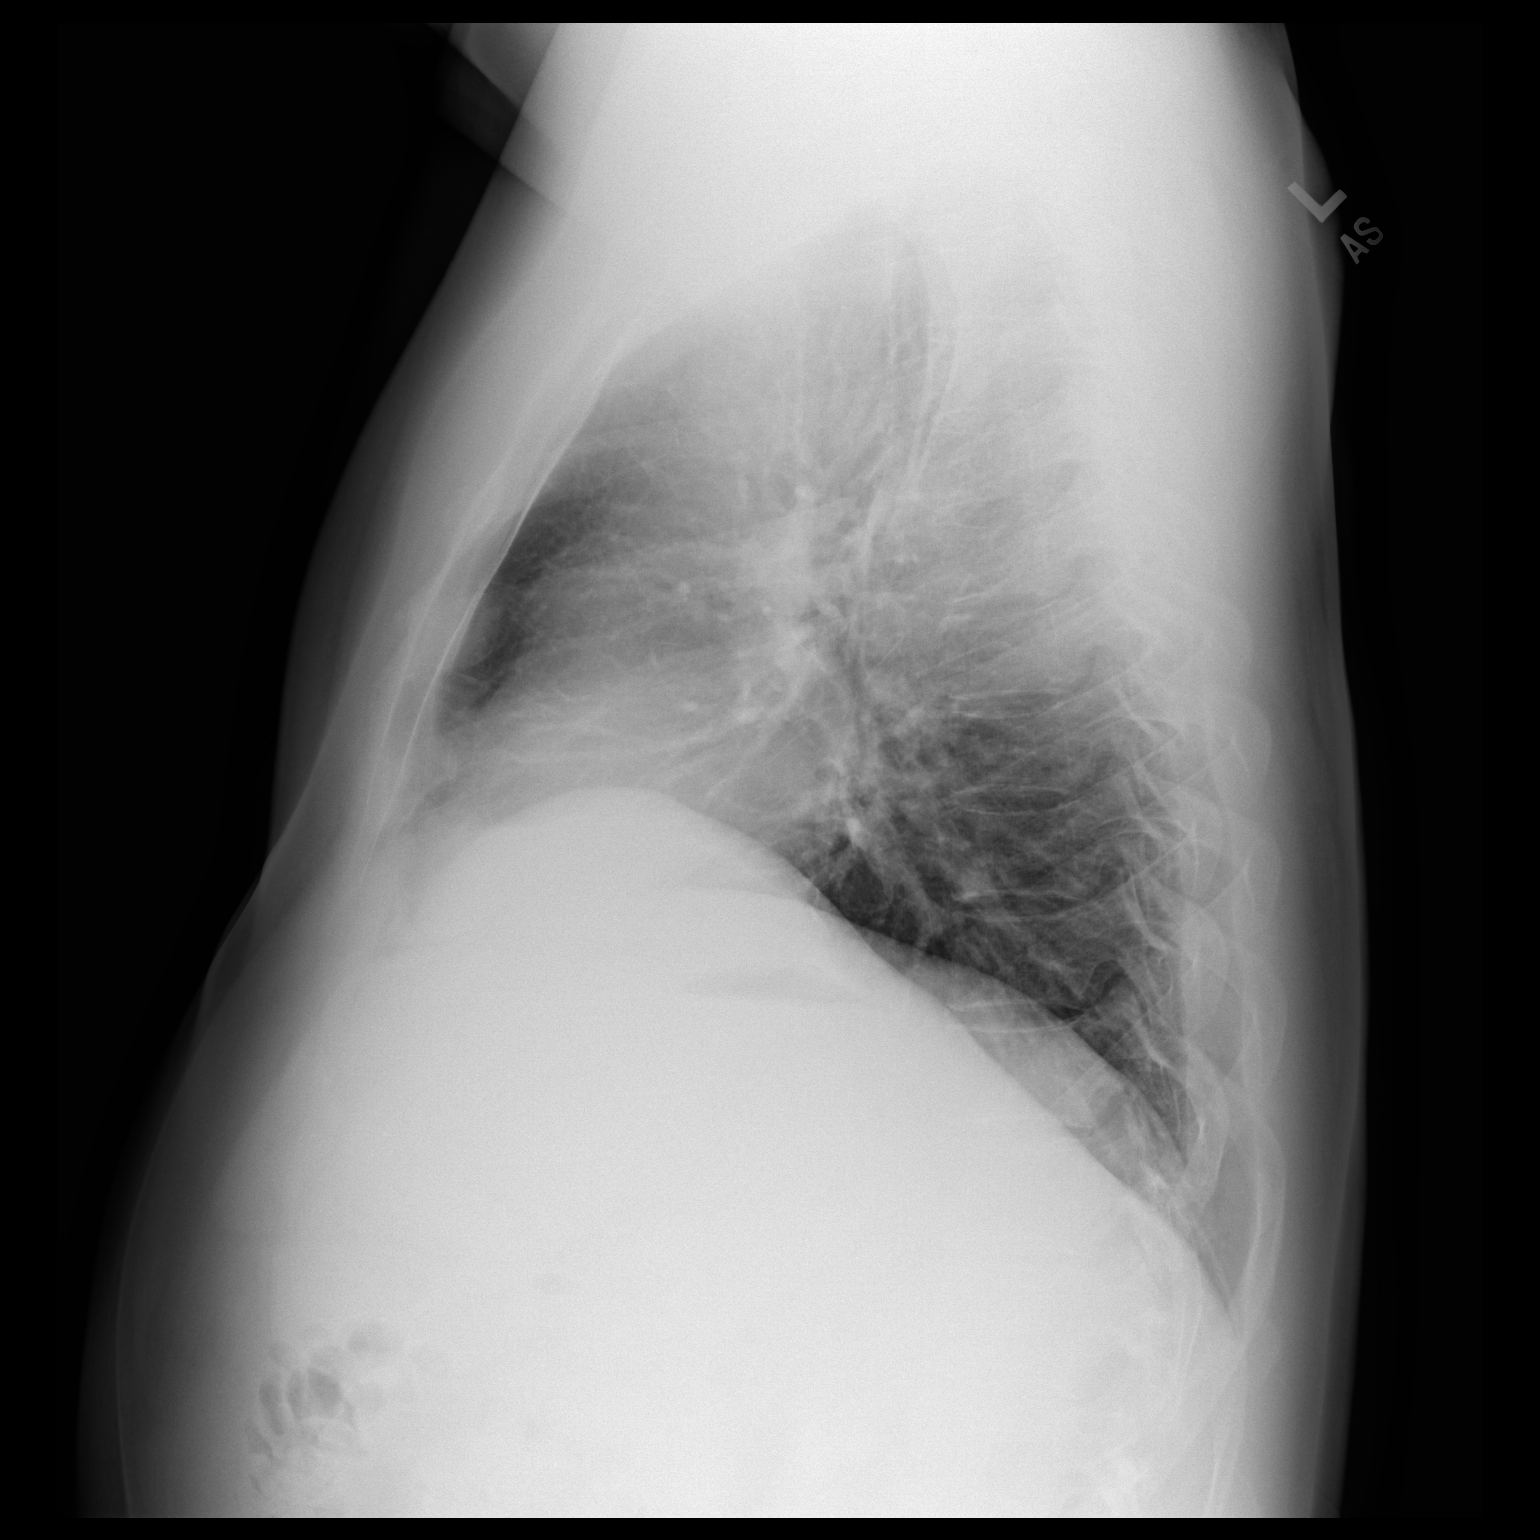

[2 of 2 positions shown; findings below may reference images not displayed]

FINDINGS: The heart size and mediastinal contours are within normal limits.
Both lungs are clear. The visualized skeletal structures are
unremarkable.
IMPRESSION: No active cardiopulmonary disease.

## 2019-07-13 ENCOUNTER — Other Ambulatory Visit: Payer: Self-pay

## 2019-07-13 ENCOUNTER — Encounter (HOSPITAL_COMMUNITY): Payer: Self-pay

## 2019-07-13 ENCOUNTER — Emergency Department (HOSPITAL_COMMUNITY)
Admission: EM | Admit: 2019-07-13 | Discharge: 2019-07-14 | Disposition: A | Discharge status: Home / Self Care | Payer: Managed Care, Other (non HMO) | Attending: Emergency Medicine | Admitting: Emergency Medicine

## 2019-07-13 DIAGNOSIS — F17220 Nicotine dependence, chewing tobacco, uncomplicated: Secondary | ICD-10-CM | POA: Diagnosis not present

## 2019-07-13 DIAGNOSIS — Z79899 Other long term (current) drug therapy: Secondary | ICD-10-CM | POA: Insufficient documentation

## 2019-07-13 DIAGNOSIS — F319 Bipolar disorder, unspecified: Secondary | ICD-10-CM | POA: Insufficient documentation

## 2019-07-13 DIAGNOSIS — I10 Essential (primary) hypertension: Secondary | ICD-10-CM | POA: Diagnosis not present

## 2019-07-13 DIAGNOSIS — Z20822 Contact with and (suspected) exposure to covid-19: Secondary | ICD-10-CM | POA: Diagnosis not present

## 2019-07-13 DIAGNOSIS — F10220 Alcohol dependence with intoxication, uncomplicated: Secondary | ICD-10-CM | POA: Insufficient documentation

## 2019-07-13 DIAGNOSIS — Y906 Blood alcohol level of 120-199 mg/100 ml: Secondary | ICD-10-CM | POA: Diagnosis not present

## 2019-07-13 DIAGNOSIS — F419 Anxiety disorder, unspecified: Secondary | ICD-10-CM

## 2019-07-13 DIAGNOSIS — F1010 Alcohol abuse, uncomplicated: Secondary | ICD-10-CM

## 2019-07-13 LAB — RAPID URINE DRUG SCREEN, HOSP PERFORMED
Amphetamines: NOT DETECTED
Barbiturates: NOT DETECTED
Benzodiazepines: POSITIVE — AB
Cocaine: NOT DETECTED
Opiates: NOT DETECTED
Tetrahydrocannabinol: NOT DETECTED

## 2019-07-13 LAB — CBC
HCT: 49.7 % (ref 39.0–52.0)
Hemoglobin: 16.8 g/dL (ref 13.0–17.0)
MCH: 30.8 pg (ref 26.0–34.0)
MCHC: 33.8 g/dL (ref 30.0–36.0)
MCV: 91.2 fL (ref 80.0–100.0)
Platelets: 395 10*3/uL (ref 150–400)
RBC: 5.45 MIL/uL (ref 4.22–5.81)
RDW: 12.4 % (ref 11.5–15.5)
WBC: 6.7 10*3/uL (ref 4.0–10.5)
nRBC: 0 % (ref 0.0–0.2)

## 2019-07-13 LAB — COMPREHENSIVE METABOLIC PANEL
ALT: 59 U/L — ABNORMAL HIGH (ref 0–44)
AST: 32 U/L (ref 15–41)
Albumin: 5 g/dL (ref 3.5–5.0)
Alkaline Phosphatase: 37 U/L — ABNORMAL LOW (ref 38–126)
Anion gap: 13 (ref 5–15)
BUN: 8 mg/dL (ref 6–20)
CO2: 27 mmol/L (ref 22–32)
Calcium: 9.5 mg/dL (ref 8.9–10.3)
Chloride: 103 mmol/L (ref 98–111)
Creatinine, Ser: 0.78 mg/dL (ref 0.61–1.24)
GFR calc Af Amer: >60 mL/min (ref >60)
GFR calc non Af Amer: >60 mL/min (ref >60)
Glucose, Bld: 117 mg/dL — ABNORMAL HIGH (ref 70–99)
Potassium: 4.6 mmol/L (ref 3.5–5.1)
Sodium: 143 mmol/L (ref 135–145)
Total Bilirubin: 0.8 mg/dL (ref 0.3–1.2)
Total Protein: 8.3 g/dL — ABNORMAL HIGH (ref 6.5–8.1)

## 2019-07-13 LAB — ETHANOL: Alcohol, Ethyl (B): 188 mg/dL — ABNORMAL HIGH (ref <10)

## 2019-07-13 MED ORDER — LORAZEPAM 1 MG PO TABS
0.0000 mg | ORAL_TABLET | Freq: Four times a day (QID) | ORAL | Status: DC
  Administered 2019-07-14: 1 mg via ORAL
  Filled 2019-07-13: qty 1

## 2019-07-13 MED ORDER — LORAZEPAM 1 MG PO TABS
1.0000 mg | ORAL_TABLET | Freq: Once | ORAL | Status: AC
  Administered 2019-07-13: 1 mg via ORAL
  Filled 2019-07-13: qty 1

## 2019-07-13 MED ORDER — THIAMINE HCL 100 MG/ML IJ SOLN: 100.0000 mg | Freq: Every day | INTRAMUSCULAR | Status: DC

## 2019-07-13 MED ORDER — LAMOTRIGINE 25 MG PO TABS
150.0000 mg | ORAL_TABLET | Freq: Every day | ORAL | Status: DC
  Administered 2019-07-14: 150 mg via ORAL
  Filled 2019-07-13: qty 2

## 2019-07-13 MED ORDER — LORAZEPAM 2 MG/ML IJ SOLN: 0.0000 mg | Freq: Four times a day (QID) | INTRAMUSCULAR | Status: DC

## 2019-07-13 MED ORDER — LORAZEPAM 1 MG PO TABS: 0.0000 mg | ORAL_TABLET | Freq: Two times a day (BID) | ORAL | Status: DC

## 2019-07-13 MED ORDER — THIAMINE HCL 100 MG PO TABS: 100.0000 mg | ORAL_TABLET | Freq: Every day | ORAL | Status: DC

## 2019-07-13 MED ORDER — LURASIDONE HCL 20 MG PO TABS
20.0000 mg | ORAL_TABLET | Freq: Every day | ORAL | Status: DC
  Filled 2019-07-13: qty 1

## 2019-07-13 MED ORDER — BUPROPION HCL ER (SR) 150 MG PO TB12: 300.0000 mg | ORAL_TABLET | Freq: Every day | ORAL | Status: DC

## 2019-07-13 MED ORDER — SODIUM CHLORIDE 0.9 % IV BOLUS
1000.0000 mL | Freq: Once | INTRAVENOUS | Status: AC
  Administered 2019-07-13: 1000 mL via INTRAVENOUS

## 2019-07-13 MED ORDER — LORAZEPAM 2 MG/ML IJ SOLN: 0.0000 mg | Freq: Two times a day (BID) | INTRAMUSCULAR | Status: DC

## 2019-07-13 MED ORDER — LISINOPRIL 10 MG PO TABS: 5.0000 mg | ORAL_TABLET | Freq: Every day | ORAL | Status: DC

## 2019-07-13 NOTE — BH Assessment (Signed)
Tele Assessment Note   Patient Name: Raymond Miller Referring Physician: Ezequiel Essex, MD Location of Patient: [facility] Location of Provider: [place]  Raymond Miller is an 39 y.o. male. Per EDP, Patient is a 39 year old male with a history of anxiety, alcoholism, recent diagnosis of bipolar-currently seeing a psychiatrist who has prescribed Klonopin to him.   Patient presents today for chief complaint of "I am an alcoholic" patient states that he has been drinking for 6 days approximately a sixpack per day and states his last drink was at noon today--although he later changes the time of his last drink to 4 PM.  Patient states that he also is taking Klonopin unknown dose which he is prescribed twice a day he states he took 2 tablets last night because he wanted to sleep and denies any attempts at hurting himself or sedating himself but states that he felt very anxious.  He states he took a single tablet this morning as well.  He is also on Wellbutrin and lamotrigine.  He denies any other chronic medical problems denies any history of epilepsy.  Denies any history of withdrawal seizures or coma.   He denies any SI, HI, AVH.  He states he has never been hospitalized for psychiatric reasons.  He has no history of self-harm.   Discussed with Crystal patient's wife she states that she recently had surgery and states that the increase dressings had been having an effect on him.  She states that she feels safe at home and does not feel that the patient is intending to harm her however she states that he does not appear to be handling his anxiety well.  Wife states that patient sees a psychiatrist to increase his dose of lamotrigine today.   TTS:    Pt presents to [facility] voluntarily for alcohol use and severe anxiety. Pt states that he recently started back abusing alcohol Friday and has not stopped drinking since then. Pt reports that he was 4 months sober but  started back Friday due to his wife recently having surgery on her hip. Pt states he drank between 12 to 16 beers Friday through Monday and today he reports drinking 8 to 9 beers today and says that he could not take it anymore and felt he needed to come to ER because he did not want to continue drinking uncontrollably. Pt current BAL 188. Pt denies SI, HI, AVH, self injurious behaviors. Pt does not have history of SI attempts or SI thoughts. Pt reports he has not slept in 5 days and his appetite has been up and down since wife's surgery. Pt reports more manic symptoms than depression, pt states he has been anxious, irritable, and isolating self. Pt also states that he has been cleaning up everyday when in his manic state.Pt states that he has a panic attack at least once a week. Pt reports he last went to Rehab back in 2015 and attended AA a few months ago. Pt states another stressor is his job and that he wants to work for himself because of the stress of the job. Pt reports no criminal background, no history of violence. Pt states he has access to weapons but no ammunition. Pt reports no history of abuse for self or family, but trauma incident, he witness father kill himself and father was alcoholic as well as uncle. Pt states he is not sure if he wants to go back into rehab but will continue to see his current  psychiatrist and take medications as prescribed, Latuda, Lamictol, Wellbutrin and Klonopin. Pt reports he is compliant with medications. Pt has no prior psychiatric inpatient hospitalizations.   Collateral:  TTS spoke with wife  Raymond Miller at [phone]  Pt's wife states that pt has been recently stressed and worried about her due to her recent surgery on hip. She states that pt drinking has also increased since her procedure and that she is worried and wants him to seek help after he voiced earlier today that he," wants the pain to end " several times. She states that he has never expressed  this before but he does not have a past of SI behavior or thoughts. She reports that pt would benefit from staying in a place to clear his thoughts tonight as pt probably has bottles of alcohol stashed throughout the home and that he has not slept for 5 nights since the surgery.   Diagnosis: Bipolar 1 Disorder, Alcohol Use Disorder  Past Medical History:  Past Medical History:  Diagnosis Date  . Alcoholism ([place])   . Anxiety   . Hypertension     History reviewed. No pertinent surgical history.  Family History: History reviewed. No pertinent family history.  Social History:  reports that he has never smoked. His smokeless tobacco use includes chew. He reports current alcohol use. He reports previous drug use.  Additional Social History:  Alcohol / Drug Use Pain Medications: see MAR Prescriptions: see MAR Over the Counter: see MAR History of alcohol / drug use?: Yes Substance #1 Name of Substance 1: Alcohol  CIWA: CIWA-Ar BP: (!) 137/95 Pulse Rate: (!) 111 Nausea and Vomiting: no nausea and no vomiting Tactile Disturbances: none Tremor: no tremor Auditory Disturbances: not present Paroxysmal Sweats: no sweat visible Visual Disturbances: not present Anxiety: no anxiety, at ease Headache, Fullness in Head: none present Agitation: normal activity Orientation and Clouding of Sensorium: oriented and can do serial additions CIWA-Ar Total: 0 COWS:    Allergies:  Allergies  Allergen Reactions  . Penicillins Other (See Comments)    Has patient had a PCN reaction causing immediate rash, facial/tongue/throat swelling, SOB or lightheadedness with hypotension: No Has patient had a PCN reaction causing severe rash involving mucus membranes or skin necrosis: No Has patient had a PCN reaction that required hospitalization: No Has patient had a PCN reaction occurring within the last 10 years: No If all of the above answers are "NO", then may proceed with Cephalosporin use.     Home  Medications: (Not in a hospital admission)   OB/GYN Status:  No LMP for male patient.  General Assessment Data Assessment unable to be completed: Yes Reason for not completing assessment: Clinician called pt's twice nurse however no one answered. Clinician attempted to call nurse secretary and was placed on hold and no answer. Clinician to move to next person in que and try to check back with pt's nurse.  Location of Assessment: [facility] TTS Assessment: In system Is this a Tele or Face-to-Face Assessment?: Tele Assessment Is this an Initial Assessment or a Re-assessment for this encounter?: Initial Assessment Patient Accompanied by:: N/A, Other Language Other than English: No Living Arrangements: Other (Comment) What gender do you identify as?: Male Marital status: Married Pregnancy Status: No Living Arrangements: Spouse/significant other Can pt return to current living arrangement?: Yes Admission Status: Voluntary Is patient capable of signing voluntary admission?: Yes Referral Source: Self/Family/Friend Insurance type: [place] Living Arrangements: Spouse/significant other Legal Guardian: (  self) Name of Psychiatrist: Dr Launa Miller Name of Therapist: None  Education Status Is patient currently in school?: No Is the patient employed, unemployed or receiving disability?: Employed  Risk to self with the past 6 months Suicidal Ideation: No Has patient been a risk to self within the past 6 months prior to admission? : No Suicidal Intent: No Has patient had any suicidal intent within the past 6 months prior to admission? : No Is patient at risk for suicide?: No Suicidal Plan?: No Has patient had any suicidal plan within the past 6 months prior to admission? : No Access to Means: No What has been your use of drugs/alcohol within the last 12 months?: beer Previous Attempts/Gestures: No How many times?: 0 Other Self Harm Risks: none Triggers for Past  Attempts: None known Intentional Self Injurious Behavior: None Family Suicide History: Yes Recent stressful life event(s): Conflict (Comment), Other (Comment) Persecutory voices/beliefs?: No Depression: Yes Depression Symptoms: Feeling angry/irritable, Isolating, Insomnia Substance abuse history and/or treatment for substance abuse?: No Suicide prevention information given to non-admitted patients: Not applicable  Risk to Others within the past 6 months Homicidal Ideation: No Does patient have any lifetime risk of violence toward others beyond the six months prior to admission? : No Thoughts of Harm to Others: No Current Homicidal Intent: No Current Homicidal Plan: No Access to Homicidal Means: No Identified Victim: none History of harm to others?: No Assessment of Violence: None Noted Violent Behavior Description: none Does patient have access to weapons?: No Criminal Charges Pending?: No Does patient have a court date: No Is patient on probation?: No  Psychosis Hallucinations: None noted Delusions: None noted  Mental Status Report Appearance/Hygiene: In scrubs Eye Contact: Good Motor Activity: Freedom of movement Speech: Logical/coherent Level of Consciousness: Alert Mood: Depressed Affect: Depressed, Flat Anxiety Level: Moderate Thought Processes: Coherent Judgement: Impaired Orientation: Person, Place, Time, Situation Obsessive Compulsive Thoughts/Behaviors: None  Cognitive Functioning Concentration: Normal Memory: Recent Intact Is patient IDD: No Insight: Good Impulse Control: Poor Appetite: Fair Have you had any weight changes? : Loss Amount of the weight change? (lbs): 5 lbs Sleep: No Change Total Hours of Sleep: 5 Vegetative Symptoms: None  ADLScreening [facility] Assessment Services) Patient's cognitive ability adequate to safely complete daily activities?: Yes Patient able to express need for assistance with ADLs?: Yes Independently performs ADLs?: Yes  (appropriate for developmental age)  Prior Inpatient Therapy Prior Inpatient Therapy: No  Prior Outpatient Therapy Prior Outpatient Therapy: Yes Prior Therapy Dates: (various) Prior Therapy Facilty/Provider(s): (various) Reason for Treatment: alcohol Does patient have an ACCT team?: No Does patient have Intensive In-House Services?  : No Does patient have [facility] services? : No Does patient have P4CC services?: No  ADL Screening (condition at time of admission) Patient's cognitive ability adequate to safely complete daily activities?: Yes Patient able to express need for assistance with ADLs?: Yes Independently performs ADLs?: Yes (appropriate for developmental age)             Regulatory affairs officer (For Healthcare) Does Patient Have a Medical Advance Directive?: No Would patient like information on creating a medical advance directive?: No - Patient declined          Disposition: Adaku, Anike, FNP, recommends overnight observation. Per [facility] pt accepted to [facility] OBS unit pending negative COVID test. Confirmed with provider. Disposition Initial Assessment Completed for this Encounter: Yes  This service was provided via telemedicine using a 2-way, interactive audio and video technology.  Names of all persons participating in this  telemedicine service and their role in this encounter. Name: Raymond Miller Role: Patient  Name: Raymond Miller Role: TTS  Name:  Role:   Name:  Role:     Donato Heinz 07/13/2019 11:43 PM

## 2019-07-13 NOTE — ED Triage Notes (Signed)
Patient's sister reports that the patient took an unknown amount of Klonopin last night with alcohol. Today the patient took 2 Klonopin tabs and drank a 12 pack of alcohol at 0900 today. When asked if he was suicidal the patient states, "I just wanted to be normal."  Patient denies any visual or auditory hallucinations or any other drug use.  Patient states he had been sober x 3 months and started drinking when his wife had hip surgery.
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
## 2019-07-13 NOTE — Discharge Instructions (Addendum)
Please follow-up with your psychiatrist and continue to be evaluated by him.  Please follow-up with your primary care doctor within the week.

## 2019-07-13 NOTE — ED Notes (Signed)
Rancour MD at bedside

## 2019-07-13 NOTE — BHH Counselor (Signed)
Clinician called pt's twice nurse however no one answered. Clinician attempted to call nurse secretary and was placed on hold and no answer. Clinician to move to next person in que and try to check back with pt's nurse.     Vertell Novak, [place], [facility], [facility] Triage Specialist [phone]

## 2019-07-13 NOTE — ED Notes (Signed)
Jereld Bickmore, wife, wants update on her husband, [phone].

## 2019-07-13 NOTE — ED Notes (Signed)
TTS machine at bedside. 

## 2019-07-13 NOTE — ED Provider Notes (Signed)
[facility] DEPT Provider Note   CSN: [id number] Arrival date & time: 07/13/19  1700     History Chief Complaint  Patient presents with  . Ingestion  . Alcohol Intoxication    Raymond Miller is a 39 y.o. male.  HPI  Patient is a 39 year old male with a history of anxiety, alcoholism, recent diagnosis of bipolar-currently seeing a psychiatrist who has prescribed Klonopin to him.  Patient presents today for chief complaint of "I am an alcoholic" patient states that he has been drinking for 6 days approximately a sixpack per day and states his last drink was at noon today--although he later changes the time of his last drink to 4 PM.  Patient states that he also is taking Klonopin unknown dose which he is prescribed twice a day he states he took 2 tablets last night because he wanted to sleep and denies any attempts at hurting himself or sedating himself but states that he felt very anxious.  He states he took a single tablet this morning as well.  He is also on Wellbutrin and lamotrigine.  He denies any other chronic medical problems denies any history of epilepsy.  Denies any history of withdrawal seizures or coma.  He denies any SI, HI, AVH.  He states he has never been hospitalized for psychiatric reasons.  He has no history of self-harm.  Discussed with Crystal patient's wife she states that she recently had surgery and states that the increase dressings had been having an effect on him.  She states that she feels safe at home and does not feel that the patient is intending to harm her however she states that he does not appear to be handling his anxiety well.  Wife states that patient sees a psychiatrist to increase his dose of lamotrigine today.     Past Medical History:  Diagnosis Date  . Alcoholism ([place])   . Anxiety   . Hypertension     Patient Active Problem List   Diagnosis Date Noted  . Anxiety 11/26/2018  . Depressive disorder 08/12/2016    History reviewed. No pertinent surgical history.     History reviewed. No pertinent family history.  Social History   Tobacco Use  . Smoking status: Never Smoker  . Smokeless tobacco: Current User    Types: Chew  Substance Use Topics  . Alcohol use: Yes  . Drug use: Not Currently    Home Medications Prior to Admission medications   Medication Sig Start Date End Date Taking? Authorizing Provider  buPROPion (WELLBUTRIN SR) 150 MG 12 hr tablet Take 150 mg by mouth 2 (two) times daily. 10/16/17   [provider]  ibuprofen (ADVIL) 800 MG tablet TAKE 1 TABLET BY MOUTH EVERY 8 HOURS WITH FOOD FOR LOWER BACK PAIN FOR 10 DAYS 08/06/18   [provider]  nabumetone (RELAFEN) 750 MG tablet Take 1 tablet (750 mg total) by mouth 2 (two) times daily as needed. 11/26/18   Hilts, Legrand Como, MD  tiZANidine (ZANAFLEX) 2 MG tablet Take 1-2 tablets (2-4 mg total) by mouth every 6 (six) hours as needed for muscle spasms. 11/26/18   Hilts, Legrand Como, MD    Allergies    Penicillins  Review of Systems   Review of Systems  Constitutional: Negative for chills and fever.  HENT: Negative for congestion.   Eyes: Negative for pain.  Respiratory: Negative for cough and shortness of breath.   Cardiovascular: Negative for chest pain and leg swelling.  Gastrointestinal: Negative for  abdominal pain and vomiting.  Genitourinary: Negative for dysuria.  Musculoskeletal: Negative for myalgias.  Skin: Negative for rash.  Neurological: Negative for dizziness and headaches.  Psychiatric/Behavioral: The patient is nervous/anxious.     Physical Exam Updated Vital Signs BP (!) 137/95   Pulse (!) 111   Temp 98.2 F (36.8 C) (Oral)   Resp 16   Ht 5\' 7"  (1.702 m)   Wt 76.2 kg   SpO2 95%   BMI 26.31 kg/m   Physical Exam Vitals and nursing note reviewed.  Constitutional:      General: He is not in acute distress.    Comments: Anxious appearing 39 year old male appears slightly older than  stated age.  HENT:     Head: Normocephalic and atraumatic.     Nose: Nose normal.     Mouth/Throat:     Mouth: Mucous membranes are dry.  Eyes:     General: No scleral icterus.    Extraocular Movements: Extraocular movements intact.     Pupils: Pupils are equal, round, and reactive to light.  Cardiovascular:     Rate and Rhythm: Normal rate and regular rhythm.     Pulses: Normal pulses.     Heart sounds: Normal heart sounds.  Pulmonary:     Effort: Pulmonary effort is normal. No respiratory distress.     Breath sounds: No wheezing.  Abdominal:     Palpations: Abdomen is soft.     Tenderness: There is no abdominal tenderness.  Musculoskeletal:     Cervical back: Normal range of motion.     Right lower leg: No edema.     Left lower leg: No edema.  Skin:    General: Skin is warm and dry.     Capillary Refill: Capillary refill takes less than 2 seconds.  Neurological:     Mental Status: He is alert. Mental status is at baseline.     Comments: Mild horizontal nystagmus   Psychiatric:        Mood and Affect: Mood normal.        Behavior: Behavior normal.     Comments: Flat affect, withdrawn, poor eye contact.  Answers questions appropriately follows commands with paucity of words.     ED Results / Procedures / Treatments   Labs (all labs ordered are listed, but only abnormal results are displayed) Labs Reviewed  COMPREHENSIVE METABOLIC PANEL  ETHANOL  CBC  RAPID URINE DRUG SCREEN, HOSP PERFORMED    EKG None  Radiology No results found.  Procedures Procedures (including critical care time)  Medications Ordered in ED Medications  LORazepam (ATIVAN) injection 0-4 mg (has no administration in time range)    Or  LORazepam (ATIVAN) tablet 0-4 mg (has no administration in time range)  LORazepam (ATIVAN) injection 0-4 mg (has no administration in time range)    Or  LORazepam (ATIVAN) tablet 0-4 mg (has no administration in time range)  thiamine tablet 100 mg (has no  administration in time range)    Or  thiamine (B-1) injection 100 mg (has no administration in time range)  LORazepam (ATIVAN) tablet 1 mg (has no administration in time range)  sodium chloride 0.9 % bolus 1,000 mL (has no administration in time range)    ED Course  I have reviewed the triage vital signs and the nursing notes.  Pertinent labs & imaging results that were available during my care of the patient were reviewed by me and considered in my medical decision making (see chart for details).  MDM Rules/Calculators/A&P                      Patient is 40 year old male with a history of alcohol abuse, anxiety, recent bipolar diagnosis on Klonopin and lamotrigine presented today with anxiety.  This appears to be multifactorial partially due to him restarting to drink after several months of sobriety as well as surgery of his wife which appears to be a stressful event for him.  He is well-appearing at this time and has only estimation a CIWA score of 4-6.   7:30 p.m.-nursing staff made myself aware that patient has fell and hit his head while he was changing into his skin.  Patient is sitting in bed in no acute distress.  He does have a bump on the top of his head.  He has been doing well has normal neuro exam except for continued nystagmus which I attribute to his inebriation.  Discussed case with Dr. Wyvonnia Dusky who evaluated patient at bedside.  Plan is requesting to hold off on CT scan and observe.  We will also obtain basic labs and consult TTS for evaluation.  Patient declined head CT and would like to leave at this time.  He has not had any withdrawal symptoms other than anxiety which appears to be baseline for him and mild tachycardia.  He has been tolerating p.o. without difficulty during entire ED stay and has had no nausea/vomiting.  9:26 PM --at end of shift change patient is willing to stay for TTS consult.  It appears that TTS attempted to call earlier however patient was not in  room during phone call.  Patient has nurse assigned and is in gastric room.  He will be assessed by TTS.  Labs ordered and pending at time of shift change.  Dr. Wyvonnia Dusky will follow up on labs and TTS recommendations and appropriately dispo.  Final Clinical Impression(s) / ED Diagnoses Final diagnoses:  Anxiety  Alcohol abuse    Rx / DC Orders ED Discharge Orders    None       Tedd Sias, [place] 07/13/19 2130    Ezequiel Essex, MD 07/13/19 2359

## 2019-07-13 NOTE — ED Notes (Signed)
[place], EDP notified of possible fall.

## 2019-07-14 LAB — RESPIRATORY PANEL BY RT PCR (FLU A&B, COVID)
Influenza A by PCR: NEGATIVE
Influenza B by PCR: NEGATIVE
SARS Coronavirus 2 by RT PCR: NEGATIVE

## 2019-07-14 NOTE — ED Notes (Signed)
Patty, RN spoke to Dr. Sherry Ruffing and he agrees that patient is able to go home. Patient will sign AMA form with Chrys Racer, Therapist, sports.

## 2019-07-14 NOTE — ED Notes (Signed)
Seth Bake, RN told Chrys Racer, RN that a sitter is in patient's room. Unknown as to why patient has a sitter because he denies SI/HI. Will follow up.

## 2019-07-14 NOTE — ED Notes (Addendum)
Chrys Racer, RN attempted to call [place] at this time with no answer.  Patty, RN notified. RN asked patient if he has SI/HI intentions and patient denies. Patient is AOx4 and ambulatory.

## 2019-07-14 NOTE — ED Notes (Signed)
RN attempted to call TTS for the second time. No answer. Will let Charge, RN know.

## 2019-07-14 NOTE — ED Notes (Addendum)
Chrys Racer, RN went to talk to patient about being transferred to [facility] for observation and patient stated "I was never told anything like this". RN explained reasoning behind patient being admitted for overnight observation and patient stated he would like to go home. Patient is in ED voluntarily and is AOx4 and ambulatory at this time. RN attempted to call TTS with no answer. Will try to call back.

## 2019-07-14 NOTE — ED Notes (Signed)
Pt called out requesting medication to help him rest. MD made aware CIWA 6 Pt given 1mg  of ativan.

## 2021-07-19 DIAGNOSIS — Z0000 Encounter for general adult medical examination without abnormal findings: Secondary | ICD-10-CM | POA: Diagnosis not present

## 2021-07-19 DIAGNOSIS — I10 Essential (primary) hypertension: Secondary | ICD-10-CM | POA: Diagnosis not present

## 2021-07-19 DIAGNOSIS — E785 Hyperlipidemia, unspecified: Secondary | ICD-10-CM | POA: Diagnosis not present

## 2022-01-17 DIAGNOSIS — I10 Essential (primary) hypertension: Secondary | ICD-10-CM | POA: Diagnosis not present

## 2022-01-17 DIAGNOSIS — F319 Bipolar disorder, unspecified: Secondary | ICD-10-CM | POA: Diagnosis not present

## 2022-01-17 DIAGNOSIS — E7800 Pure hypercholesterolemia, unspecified: Secondary | ICD-10-CM | POA: Diagnosis not present

## 2022-04-19 DIAGNOSIS — D2230 Melanocytic nevi of unspecified part of face: Secondary | ICD-10-CM | POA: Diagnosis not present

## 2022-04-19 DIAGNOSIS — L578 Other skin changes due to chronic exposure to nonionizing radiation: Secondary | ICD-10-CM | POA: Diagnosis not present

## 2022-04-19 DIAGNOSIS — L821 Other seborrheic keratosis: Secondary | ICD-10-CM | POA: Diagnosis not present

## 2022-04-19 DIAGNOSIS — B353 Tinea pedis: Secondary | ICD-10-CM | POA: Diagnosis not present

## 2022-08-07 DIAGNOSIS — E785 Hyperlipidemia, unspecified: Secondary | ICD-10-CM | POA: Diagnosis not present

## 2022-08-07 DIAGNOSIS — F319 Bipolar disorder, unspecified: Secondary | ICD-10-CM | POA: Diagnosis not present

## 2022-08-07 DIAGNOSIS — Z125 Encounter for screening for malignant neoplasm of prostate: Secondary | ICD-10-CM | POA: Diagnosis not present

## 2022-08-07 DIAGNOSIS — I10 Essential (primary) hypertension: Secondary | ICD-10-CM | POA: Diagnosis not present

## 2022-08-07 DIAGNOSIS — Z0000 Encounter for general adult medical examination without abnormal findings: Secondary | ICD-10-CM | POA: Diagnosis not present

## 2022-12-05 DIAGNOSIS — J069 Acute upper respiratory infection, unspecified: Secondary | ICD-10-CM | POA: Diagnosis not present

## 2022-12-05 DIAGNOSIS — U071 COVID-19: Secondary | ICD-10-CM | POA: Diagnosis not present

## 2023-01-14 DIAGNOSIS — M545 Low back pain, unspecified: Secondary | ICD-10-CM | POA: Diagnosis not present

## 2023-01-22 DIAGNOSIS — M545 Low back pain, unspecified: Secondary | ICD-10-CM | POA: Diagnosis not present

## 2023-02-05 DIAGNOSIS — M545 Low back pain, unspecified: Secondary | ICD-10-CM | POA: Diagnosis not present

## 2023-02-12 DIAGNOSIS — E7800 Pure hypercholesterolemia, unspecified: Secondary | ICD-10-CM | POA: Diagnosis not present

## 2023-02-12 DIAGNOSIS — I10 Essential (primary) hypertension: Secondary | ICD-10-CM | POA: Diagnosis not present

## 2023-02-12 DIAGNOSIS — F411 Generalized anxiety disorder: Secondary | ICD-10-CM | POA: Diagnosis not present

## 2023-04-25 DIAGNOSIS — D2239 Melanocytic nevi of other parts of face: Secondary | ICD-10-CM | POA: Diagnosis not present

## 2023-04-25 DIAGNOSIS — D485 Neoplasm of uncertain behavior of skin: Secondary | ICD-10-CM | POA: Diagnosis not present

## 2023-04-25 DIAGNOSIS — C44612 Basal cell carcinoma of skin of right upper limb, including shoulder: Secondary | ICD-10-CM | POA: Diagnosis not present

## 2023-04-25 DIAGNOSIS — D225 Melanocytic nevi of trunk: Secondary | ICD-10-CM | POA: Diagnosis not present

## 2023-04-25 DIAGNOSIS — L578 Other skin changes due to chronic exposure to nonionizing radiation: Secondary | ICD-10-CM | POA: Diagnosis not present

## 2023-04-25 DIAGNOSIS — B353 Tinea pedis: Secondary | ICD-10-CM | POA: Diagnosis not present

## 2023-07-21 DIAGNOSIS — L119 Acantholytic disorder, unspecified: Secondary | ICD-10-CM | POA: Diagnosis not present

## 2023-07-21 DIAGNOSIS — C44612 Basal cell carcinoma of skin of right upper limb, including shoulder: Secondary | ICD-10-CM | POA: Diagnosis not present

## 2023-08-27 DIAGNOSIS — Z23 Encounter for immunization: Secondary | ICD-10-CM | POA: Diagnosis not present

## 2023-08-27 DIAGNOSIS — Z0000 Encounter for general adult medical examination without abnormal findings: Secondary | ICD-10-CM | POA: Diagnosis not present

## 2023-08-27 DIAGNOSIS — E785 Hyperlipidemia, unspecified: Secondary | ICD-10-CM | POA: Diagnosis not present

## 2023-08-27 DIAGNOSIS — Z125 Encounter for screening for malignant neoplasm of prostate: Secondary | ICD-10-CM | POA: Diagnosis not present

## 2023-08-27 DIAGNOSIS — F411 Generalized anxiety disorder: Secondary | ICD-10-CM | POA: Diagnosis not present

## 2023-08-27 DIAGNOSIS — I10 Essential (primary) hypertension: Secondary | ICD-10-CM | POA: Diagnosis not present

## 2023-12-16 DIAGNOSIS — E291 Testicular hypofunction: Secondary | ICD-10-CM | POA: Diagnosis not present

## 2023-12-16 DIAGNOSIS — Z1329 Encounter for screening for other suspected endocrine disorder: Secondary | ICD-10-CM | POA: Diagnosis not present

## 2023-12-25 DIAGNOSIS — I10 Essential (primary) hypertension: Secondary | ICD-10-CM | POA: Diagnosis not present

## 2024-01-26 DIAGNOSIS — Z79890 Hormone replacement therapy (postmenopausal): Secondary | ICD-10-CM | POA: Diagnosis not present

## 2024-01-26 DIAGNOSIS — E291 Testicular hypofunction: Secondary | ICD-10-CM | POA: Diagnosis not present

## 2024-01-28 DIAGNOSIS — R6882 Decreased libido: Secondary | ICD-10-CM | POA: Diagnosis not present

## 2024-01-28 DIAGNOSIS — R5383 Other fatigue: Secondary | ICD-10-CM | POA: Diagnosis not present

## 2024-01-28 DIAGNOSIS — E291 Testicular hypofunction: Secondary | ICD-10-CM | POA: Diagnosis not present

## 2024-01-28 DIAGNOSIS — M2550 Pain in unspecified joint: Secondary | ICD-10-CM | POA: Diagnosis not present

## 2024-03-01 DIAGNOSIS — F411 Generalized anxiety disorder: Secondary | ICD-10-CM | POA: Diagnosis not present

## 2024-03-01 DIAGNOSIS — E785 Hyperlipidemia, unspecified: Secondary | ICD-10-CM | POA: Diagnosis not present

## 2024-03-01 DIAGNOSIS — I10 Essential (primary) hypertension: Secondary | ICD-10-CM | POA: Diagnosis not present

## 2024-04-19 DIAGNOSIS — Z1211 Encounter for screening for malignant neoplasm of colon: Secondary | ICD-10-CM | POA: Diagnosis not present

## 2024-04-19 DIAGNOSIS — K5730 Diverticulosis of large intestine without perforation or abscess without bleeding: Secondary | ICD-10-CM | POA: Diagnosis not present

## 2024-04-23 DIAGNOSIS — B353 Tinea pedis: Secondary | ICD-10-CM | POA: Diagnosis not present

## 2024-04-23 DIAGNOSIS — L578 Other skin changes due to chronic exposure to nonionizing radiation: Secondary | ICD-10-CM | POA: Diagnosis not present

## 2024-04-23 DIAGNOSIS — D224 Melanocytic nevi of scalp and neck: Secondary | ICD-10-CM | POA: Diagnosis not present

## 2024-04-23 DIAGNOSIS — L821 Other seborrheic keratosis: Secondary | ICD-10-CM | POA: Diagnosis not present

## 2024-04-26 DIAGNOSIS — Z79890 Hormone replacement therapy (postmenopausal): Secondary | ICD-10-CM | POA: Diagnosis not present

## 2024-04-26 DIAGNOSIS — E291 Testicular hypofunction: Secondary | ICD-10-CM | POA: Diagnosis not present

## 2024-04-28 DIAGNOSIS — E291 Testicular hypofunction: Secondary | ICD-10-CM | POA: Diagnosis not present

## 2024-04-28 DIAGNOSIS — Z6827 Body mass index (BMI) 27.0-27.9, adult: Secondary | ICD-10-CM | POA: Diagnosis not present

## 2024-04-28 DIAGNOSIS — R6882 Decreased libido: Secondary | ICD-10-CM | POA: Diagnosis not present

## 2024-04-28 DIAGNOSIS — R5383 Other fatigue: Secondary | ICD-10-CM | POA: Diagnosis not present
